# Patient Record
Sex: Female | Born: 1950 | Race: White | Hispanic: No | Marital: Married | Smoking: Never smoker
Health system: Southern US, Community
[De-identification: ages and names within clinical notes are randomized; demographics above are authoritative.]

## PROBLEM LIST (undated history)

## (undated) DIAGNOSIS — E119 Type 2 diabetes mellitus without complications: Secondary | ICD-10-CM

## (undated) HISTORY — PX: GASTRIC BYPASS: SHX52

## (undated) HISTORY — PX: CHOLECYSTECTOMY: SHX55

---

## 2018-06-22 ENCOUNTER — Ambulatory Visit: Payer: Medicare Other

## 2018-06-22 ENCOUNTER — Other Ambulatory Visit: Payer: Self-pay

## 2018-06-22 ENCOUNTER — Ambulatory Visit
Admission: EM | Admit: 2018-06-22 | Discharge: 2018-06-22 | Disposition: A | Discharge status: Home / Self Care | Payer: Medicare Other | Attending: Family Medicine | Admitting: Family Medicine

## 2018-06-22 ENCOUNTER — Encounter: Payer: Self-pay | Admitting: *Deleted

## 2018-06-22 DIAGNOSIS — S61313A Laceration without foreign body of left middle finger with damage to nail, initial encounter: Secondary | ICD-10-CM | POA: Insufficient documentation

## 2018-06-22 DIAGNOSIS — S62663B Nondisplaced fracture of distal phalanx of left middle finger, initial encounter for open fracture: Secondary | ICD-10-CM

## 2018-06-22 DIAGNOSIS — S62639B Displaced fracture of distal phalanx of unspecified finger, initial encounter for open fracture: Secondary | ICD-10-CM | POA: Insufficient documentation

## 2018-06-22 DIAGNOSIS — S61213A Laceration without foreign body of left middle finger without damage to nail, initial encounter: Secondary | ICD-10-CM | POA: Diagnosis not present

## 2018-06-22 DIAGNOSIS — W231XXA Caught, crushed, jammed, or pinched between stationary objects, initial encounter: Secondary | ICD-10-CM

## 2018-06-22 HISTORY — DX: Type 2 diabetes mellitus without complications: E11.9

## 2018-06-22 MED ORDER — CEPHALEXIN 500 MG PO CAPS: 500.0000 mg | ORAL_CAPSULE | Freq: Four times a day (QID) | ORAL | 0 refills | Status: AC

## 2018-06-22 MED ORDER — LIDOCAINE HCL (PF) 1 % IJ SOLN: 5.0000 mL | Freq: Once | INTRAMUSCULAR | Status: DC

## 2018-06-22 MED ORDER — MUPIROCIN 2 % EX OINT: TOPICAL_OINTMENT | CUTANEOUS | 0 refills | Status: AC

## 2018-06-22 MED ORDER — BUPIVACAINE HCL (PF) 0.25 % IJ SOLN: 5.0000 mL | Freq: Once | INTRAMUSCULAR | Status: DC

## 2018-06-22 NOTE — Discharge Instructions (Addendum)
Take medication as prescribed. Rest. Drink plenty of fluids. Keep clean. Keep in finger splint.   Return to Urgent care in 2-3 days for wound recheck.  Follow up with orthopedic hand this coming week for follow up.   Suture removal in 7-10 days.    Follow up with your primary care physician this week as needed. Return to Urgent care for new or worsening concerns.

## 2018-06-22 NOTE — ED Provider Notes (Addendum)
[facility] ____________________________________________  Time seen: Approximately 6:02 PM  I have reviewed the triage vital signs and the nursing notes.   HISTORY  Chief Complaint Finger Injury   HPI Kathy English is a 68 y.o. female presenting for evaluation of left middle finger pain post injury that occurred just prior to arrival.  Patient states that she accidentally shot a car door on her finger causing injury.  States tetanus immunization is up-to-date and within 10 years.  States pain is currently mild to moderate.  Has been bleeding.  Denies pain radiation, other injuries or other complaints.  No alleviating measures attempted.  Denies aggravating factors.  Denies other complaints.  Reports tetanus immunization is up-to-date and within 10 years.    Past Medical History:  Diagnosis Date  . Diabetes mellitus without complication (HCC)     There are no active problems to display for this patient.   Past Surgical History:  Procedure Laterality Date  . CHOLECYSTECTOMY    . GASTRIC BYPASS        Current Facility-Administered Medications:  .  bupivacaine (PF) (MARCAINE) 0.25 % injection 5 mL, 5 mL, Infiltration, Once, Renford Dills, NP .  lidocaine (PF) (XYLOCAINE) 1 % injection 5 mL, 5 mL, Other, Once, Renford Dills, NP  Current Outpatient Medications:  .  cephALEXin (KEFLEX) 500 MG capsule, Take 1 capsule (500 mg total) by mouth 4 (four) times daily for 7 days., Disp: 28 capsule, Rfl: 0 .  mupirocin ointment (BACTROBAN) 2 %, Apply two times a day for 7 days., Disp: 22 g, Rfl: 0  Allergies Hexachlorophene; Penicillins; Sulfa antibiotics; and Tetracyclines & related  History reviewed. No pertinent family history.  Social History Social History   Tobacco Use  . Smoking status: Never Smoker  . Smokeless tobacco: Never Used  Substance Use Topics  . Alcohol use: Not on file  . Drug use: Not on file    Review of Systems Constitutional:  No fever Cardiovascular: Denies chest pain. Respiratory: Denies shortness of breath. Musculoskeletal: Positive left finger injury. Skin: Positive for laceration. ____________________________________________   PHYSICAL EXAM:  VITAL SIGNS: ED Triage Vitals [06/22/18 1718]  Enc Vitals Group     BP (!) 148/86     Pulse Rate 79     Resp 17     Temp 98.2 F (36.8 C)     Temp Source Oral     SpO2 100 %     Weight      Height      Head Circumference      Peak Flow      Pain Score 7     Pain Loc      Pain Edu?      Excl. in GC?     Constitutional: Alert and oriented. Well appearing and in no acute distress. ENT      Head: Normocephalic and atraumatic. Cardiovascular: Good peripheral circulation. Respiratory: Normal respiratory effort without tachypnea nor retractions.  Musculoskeletal: Steady gait.  See skin below. Neurologic:  Normal speech and language.Speech is normal. No gait instability.  Skin:  Skin is warm, dry.  Except:    Left middle digit injury as depicted above, mild to moderate tenderness to direct palpation, lateral nail 1.5cm laceration with nail removed from matrix with active bleeding, no nail bed laceration found, no foreign body, full range of motion present, no motor or tendon deficit good resisted distal flexion and extension, left hand otherwise nontender.   Post repair.  Psychiatric: Mood and affect  are normal. Speech and behavior are normal. Patient exhibits appropriate insight and judgment   ___________________________________________   LABS (all labs ordered are listed, but only abnormal results are displayed)  Labs Reviewed - No data to display ____________________________________________  RADIOLOGY  Dg Finger Middle Left  Result Date: 06/22/2018 CLINICAL DATA:  Slammed finger in car door. EXAM: LEFT MIDDLE FINGER 2+V COMPARISON:  None. FINDINGS: Acute nondisplaced fracture distal phalanx tuft, no intra-articular distension. No destructive  bony lesions. No advanced degenerative change. Small bubbles of gas in distal phalanx soft tissues including subungual gas. No radiopaque foreign bodies. IMPRESSION: 1. Acute nondisplaced open fracture third distal phalanx. Electronically Signed   By: Awilda Metro M.D.   On: 06/22/2018 18:11   ____________________________________________   PROCEDURES Procedures  Procedure(s) performed:  Procedure explained and verbal consent obtained. Consent: Verbal consent obtained. Written consent not obtained. Risks and benefits: risks, benefits and alternatives were discussed Patient identity confirmed: verbally with patient and hospital-assigned identification number  Consent given by: patient   Laceration Repair Location: Left middle finger Length: 1.5 cm Foreign bodies: no foreign bodies Tendon involvement: none Nerve involvement: none Preparation: Patient was prepped and draped in the usual sterile fashion. Anesthesia with digital block with 0.25% bupivacaine 3 mL's  Cleaned with Betadine Irrigation solution: saline Irrigation method: jet lavage Amount of cleaning: copious No nailbed laceration. Sterile forceps utilized and now reinserted to cuticle bed.  Single suture 6-0 Vicryl.  Now at proximal base Repaired with 5-0 nylon Number of sutures: 2 Technique: simple interrupted  Approximation: loose Patient tolerate well. Wound well approximated post repair.  Distal finger splint also applied. Antibiotic ointment and dressing applied.  Wound care instructions provided.  Observe for any signs of infection or other problems.      INITIAL IMPRESSION / ASSESSMENT AND PLAN / ED COURSE  Pertinent labs & imaging results that were available during my care of the patient were reviewed by me and considered in my medical decision making (see chart for details).  Well-appearing patient.  No acute distress.  Patient crush injury to left middle finger with tuft fracture nondisplaced, open.   Copiously cleaned and irrigated.  Wound repaired as above.  Recommend 2 to 3-day wound check.  Follow-up in 5 days with orthopedic hand specialty, information given.  Will treat with Keflex, patient states that she can tolerate Keflex well.  Topical Bactroban.  Tylenol ibuprofen as needed.  Discussed close monitoring, keeping clean and supportive care.  Suture removal in 7 to 10 days.  Discussed follow up with Primary care physician this week. Discussed follow up and return parameters including no resolution or any worsening concerns. Patient verbalized understanding and agreed to plan.   ____________________________________________   FINAL CLINICAL IMPRESSION(S) / ED DIAGNOSES  Final diagnoses:  Open fracture of tuft of distal phalanx of finger  Laceration of left middle finger without foreign body with damage to nail, initial encounter     ED Discharge Orders         Ordered    cephALEXin (KEFLEX) 500 MG capsule  4 times daily     06/22/18 1857    mupirocin ointment (BACTROBAN) 2 %     06/22/18 1857           Note: This dictation was prepared with Dragon dictation along with smaller phrase technology. Any transcriptional errors that result from this process are unintentional.         Renford Dills, NP 06/22/18 Brooke Pace    Renford Dills, NP  06/22/18 2038  

## 2018-06-22 NOTE — ED Triage Notes (Signed)
Patient reports slamming left middle finger in car door pta. Bleeding controlled.

## 2018-06-22 NOTE — ED Notes (Signed)
Patient was here for visit for laceration and stated her Tdap was UTD but care everywhere stated her last one was in 2005. Patient was called and told per Renford Dills to return immediately but patient was home and wanted to wait until 2-3 days for her wound check to get her tdap but was advised against it. States she will come in tomorrow for a nurse visit.

## 2018-06-23 ENCOUNTER — Ambulatory Visit
Admission: EM | Admit: 2018-06-23 | Discharge: 2018-06-23 | Disposition: A | Discharge status: Home / Self Care | Payer: Medicare Other | Attending: Family Medicine | Admitting: Family Medicine

## 2018-06-23 DIAGNOSIS — S62663D Nondisplaced fracture of distal phalanx of left middle finger, subsequent encounter for fracture with routine healing: Secondary | ICD-10-CM | POA: Diagnosis not present

## 2018-06-23 DIAGNOSIS — Z23 Encounter for immunization: Secondary | ICD-10-CM

## 2018-06-23 DIAGNOSIS — S61213D Laceration without foreign body of left middle finger without damage to nail, subsequent encounter: Secondary | ICD-10-CM

## 2018-06-23 MED ORDER — TETANUS-DIPHTH-ACELL PERTUSSIS 5-2.5-18.5 LF-MCG/0.5 IM SUSP
0.5000 mL | Freq: Once | INTRAMUSCULAR | Status: AC
  Administered 2018-06-23: 0.5 mL via INTRAMUSCULAR

## 2018-06-23 NOTE — ED Triage Notes (Signed)
Pt reports seen here last night after finger injury and was called back after looking up immunization record and reports needs Tetanus immunization. Denies other complaints wound dressing intact no obvious signs infection or bleeding 98.1

## 2018-06-23 NOTE — ED Notes (Signed)
No signs of reaction to Immunization.

## 2018-06-25 ENCOUNTER — Ambulatory Visit
Admission: EM | Admit: 2018-06-25 | Discharge: 2018-06-25 | Disposition: A | Discharge status: Home / Self Care | Payer: Medicare Other | Attending: Family Medicine | Admitting: Family Medicine

## 2018-06-25 DIAGNOSIS — Z5189 Encounter for other specified aftercare: Secondary | ICD-10-CM | POA: Diagnosis not present

## 2018-06-25 DIAGNOSIS — S61213D Laceration without foreign body of left middle finger without damage to nail, subsequent encounter: Secondary | ICD-10-CM

## 2018-06-25 NOTE — ED Triage Notes (Signed)
Pt here for wound check of her left index finger. No problems reported except pain.

## 2018-06-25 NOTE — ED Provider Notes (Signed)
[facility]    CSN: [id number] Arrival date & time: 06/25/18  1505     History   Chief Complaint Chief Complaint  Patient presents with  . Wound Check    HPI Kathy English is a 68 y.o. female.   HPI  68 year old female presents for a wound check of her left middle finger.  She sustained a laceration with fracture of the distal tuft.  This was repaired lysing one 6-0 Vicryl and 2 5/0 nylons.  There is a very good result.  She has made contact with the orthopedist but has not to schedule an appointment.  Is has some residual pain but has not had any evidence of infection.          Past Medical History:  Diagnosis Date  . Diabetes mellitus without complication (HCC)     There are no active problems to display for this patient.   Past Surgical History:  Procedure Laterality Date  . CHOLECYSTECTOMY    . GASTRIC BYPASS      OB History   No obstetric history on file.      Home Medications    Prior to Admission medications   Medication Sig Start Date End Date Taking? Authorizing Provider  cephALEXin (KEFLEX) 500 MG capsule Take 1 capsule (500 mg total) by mouth 4 (four) times daily for 7 days. 06/22/18 06/29/18  Renford Dills, NP  mupirocin ointment (BACTROBAN) 2 % Apply two times a day for 7 days. 06/22/18   Renford Dills, NP    Family History Family History  Problem Relation Age of Onset  . Diabetes Mother   . Cancer Father     Social History Social History   Tobacco Use  . Smoking status: Never Smoker  . Smokeless tobacco: Never Used  Substance Use Topics  . Alcohol use: Not on file  . Drug use: Not on file     Allergies   Hexachlorophene; Penicillins; Sulfa antibiotics; and Tetracyclines & related   Review of Systems Review of Systems  Constitutional: Positive for activity change. Negative for chills, fatigue and fever.  Skin: Positive for color change and wound.  All other systems reviewed and are  negative.    Physical Exam Triage Vital Signs ED Triage Vitals  Enc Vitals Group     BP 06/25/18 0934 (!) 130/96     Pulse Rate 06/25/18 0934 89     Resp 06/25/18 0934 18     Temp 06/25/18 0934 98 F (36.7 C)     Temp Source 06/25/18 0934 Oral     SpO2 06/25/18 0934 98 %     Weight 06/25/18 0935 200 lb (90.7 kg)     Height 06/25/18 0935 5\' 3"  (1.6 m)     Head Circumference --      Peak Flow --      Pain Score 06/25/18 0935 5     Pain Loc --      Pain Edu? --      Excl. in GC? --    No data found.  Updated Vital Signs BP (!) 130/96 (BP Location: Right Arm)   Pulse 89   Temp 98 F (36.7 C) (Oral)   Resp 18   Ht 5\' 3"  (1.6 m)   Wt 200 lb (90.7 kg)   SpO2 98%   BMI 35.43 kg/m   Visual Acuity Right Eye Distance:   Left Eye Distance:   Bilateral Distance:    Right Eye Near:   Left Eye  Near:    Bilateral Near:     Physical Exam Vitals signs and nursing note reviewed.  Constitutional:      General: She is not in acute distress.    Appearance: Normal appearance. She is not ill-appearing, toxic-appearing or diaphoretic.  HENT:     Head: Normocephalic.     Nose: Nose normal.     Mouth/Throat:     Mouth: Mucous membranes are moist.     Pharynx: Oropharynx is clear.  Eyes:     General:        Right eye: No discharge.        Left eye: No discharge.     Conjunctiva/sclera: Conjunctivae normal.  Neck:     Musculoskeletal: Normal range of motion and neck supple.  Musculoskeletal: Normal range of motion.     Comments: Refer to photographs for details  Skin:    General: Skin is warm and dry.  Neurological:     General: No focal deficit present.     Mental Status: She is alert and oriented to person, place, and time.  Psychiatric:        Mood and Affect: Mood normal.        Behavior: Behavior normal.        Thought Content: Thought content normal.        Judgment: Judgment normal.      UC Treatments / Results  Labs (all labs ordered are listed, but only  abnormal       results are displayed) Labs Reviewed - No data to display  EKG None  Radiology No results found.  Procedures Procedures (including critical care time)  Medications Ordered in UC Medications - No data to display  Initial Impression / Assessment and Plan / UC Course  I have reviewed the triage vital signs and the nursing notes.  Pertinent labs & imaging results that were available during my care of the patient were reviewed by me and considered in my medical decision making (see chart for details).   Wound is healing very nicely.  Continue using the splint as necessary to prevent reinjury.  Plan removal of sutures at 10 days.  Continue the use of Bactroban ointment 3 times daily.   Final Clinical Impressions(s) / UC Diagnoses   Final diagnoses:  Visit for wound check   Discharge Instructions   None    ED Prescriptions    None     Controlled Substance Prescriptions [place] Controlled Substance Registry consulted? Not Applicable   Lutricia Feil, PA-C 06/25/18 1646

## 2020-10-21 IMAGING — CR DG FINGER MIDDLE 2+V*L*
3 series · 3 of 3 positions shown · non-contrast
Comparison: None.

CLINICAL DATA: Slammed finger in car door.

EXAM:
LEFT MIDDLE FINGER 2+V

[finger ap]
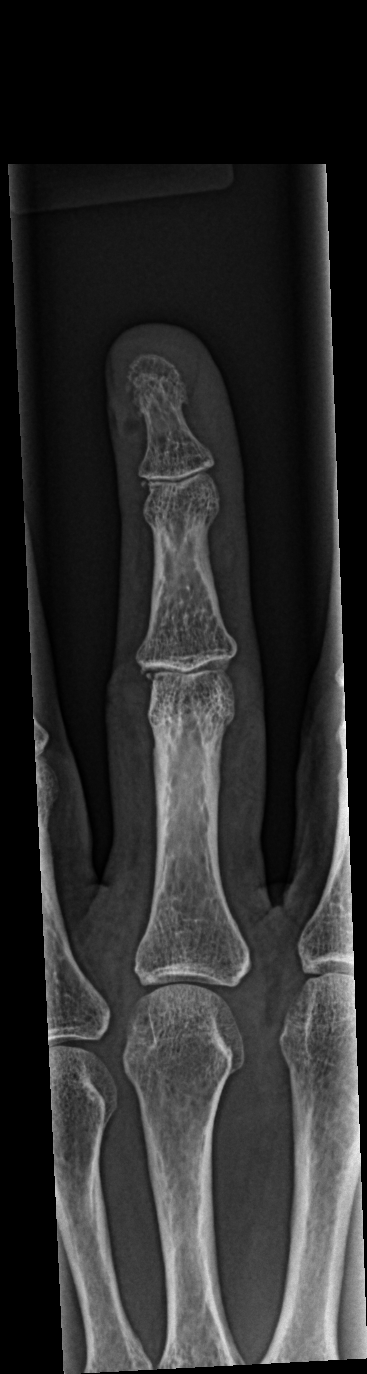

[finger obl]
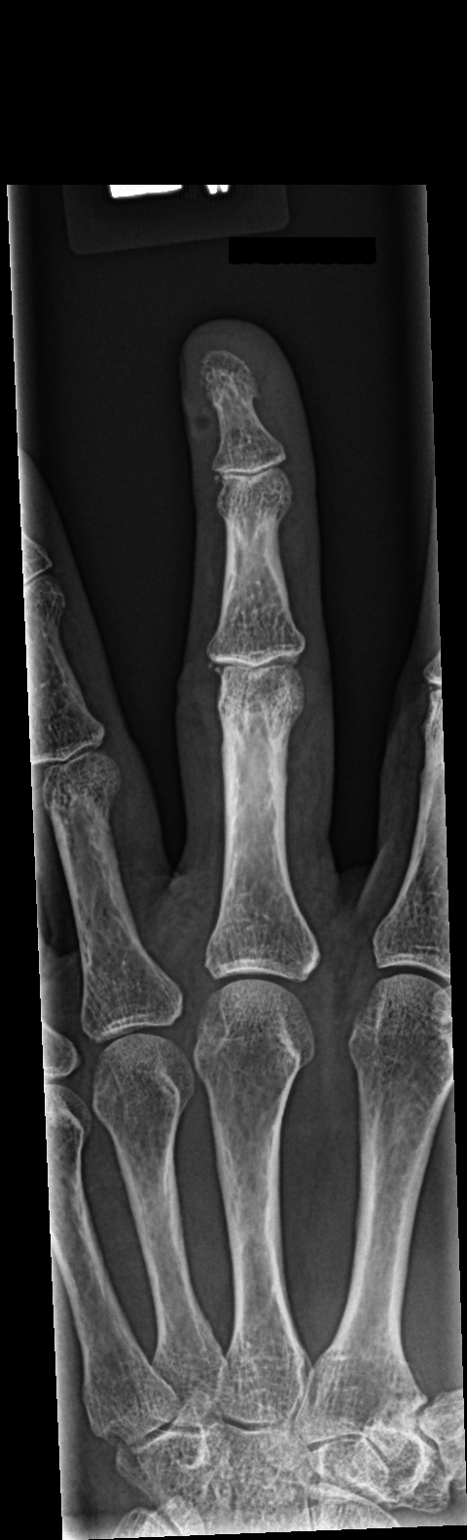

[finger lat]
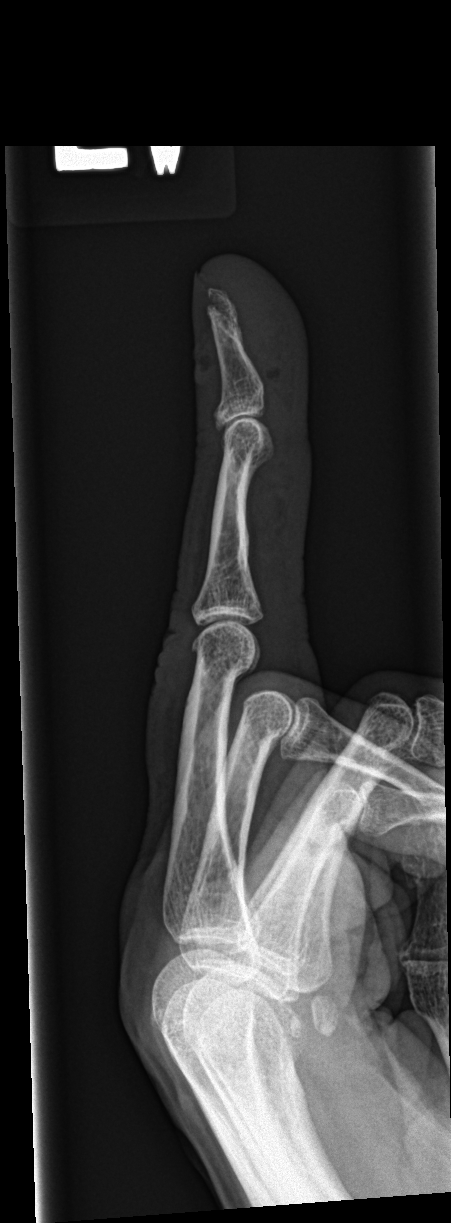

[3 of 3 positions shown; findings below may reference images not displayed]

FINDINGS: Acute nondisplaced fracture distal phalanx tuft, no intra-articular
distension. No destructive bony lesions. No advanced degenerative
change. Small bubbles of gas in distal phalanx soft tissues
including subungual gas. No radiopaque foreign bodies.
IMPRESSION: 1. Acute nondisplaced open fracture third distal phalanx.
